# Patient Record
Sex: Male | Born: 1960 | Race: White | Hispanic: No | Smoking: Current some day smoker
Health system: Southern US, Community
[De-identification: ages and names within clinical notes are randomized; demographics above are authoritative.]

## PROBLEM LIST (undated history)

## (undated) ENCOUNTER — Telehealth

## (undated) ENCOUNTER — Inpatient Hospital Stay

## (undated) ENCOUNTER — Ambulatory Visit

## (undated) ENCOUNTER — Telehealth
Attending: Pharmacist Clinician (PhC)/ Clinical Pharmacy Specialist | Primary: Pharmacist Clinician (PhC)/ Clinical Pharmacy Specialist

## (undated) ENCOUNTER — Encounter

## (undated) ENCOUNTER — Ambulatory Visit: Payer: MEDICAID | Attending: Gastroenterology | Primary: Gastroenterology

## (undated) ENCOUNTER — Encounter
Attending: Pharmacist Clinician (PhC)/ Clinical Pharmacy Specialist | Primary: Pharmacist Clinician (PhC)/ Clinical Pharmacy Specialist

## (undated) ENCOUNTER — Ambulatory Visit: Attending: Family | Primary: Family

## (undated) ENCOUNTER — Ambulatory Visit
Attending: Pharmacist Clinician (PhC)/ Clinical Pharmacy Specialist | Primary: Pharmacist Clinician (PhC)/ Clinical Pharmacy Specialist

## (undated) DIAGNOSIS — M1990 Unspecified osteoarthritis, unspecified site: Secondary | ICD-10-CM

## (undated) DIAGNOSIS — M109 Gout, unspecified: Secondary | ICD-10-CM

## (undated) DIAGNOSIS — I10 Essential (primary) hypertension: Secondary | ICD-10-CM

## (undated) HISTORY — PX: APPENDECTOMY: SHX54

---

## 2011-01-15 ENCOUNTER — Emergency Department (HOSPITAL_COMMUNITY): Payer: Self-pay

## 2011-01-15 ENCOUNTER — Emergency Department (HOSPITAL_COMMUNITY)
Admission: EM | Admit: 2011-01-15 | Discharge: 2011-01-15 | Disposition: A | Discharge status: Home / Self Care | Payer: Self-pay | Attending: Emergency Medicine | Admitting: Emergency Medicine

## 2011-01-15 DIAGNOSIS — W108XXA Fall (on) (from) other stairs and steps, initial encounter: Secondary | ICD-10-CM | POA: Insufficient documentation

## 2011-01-15 DIAGNOSIS — S52209A Unspecified fracture of shaft of unspecified ulna, initial encounter for closed fracture: Secondary | ICD-10-CM | POA: Insufficient documentation

## 2011-01-16 ENCOUNTER — Ambulatory Visit (HOSPITAL_COMMUNITY)
Admission: RE | Admit: 2011-01-16 | Discharge: 2011-01-16 | Disposition: A | Discharge status: Home / Self Care | Payer: Self-pay | Source: Ambulatory Visit | Attending: Orthopaedic Surgery | Admitting: Orthopaedic Surgery

## 2011-01-16 ENCOUNTER — Other Ambulatory Visit (HOSPITAL_COMMUNITY): Payer: Self-pay | Admitting: Orthopaedic Surgery

## 2011-01-16 DIAGNOSIS — S52209A Unspecified fracture of shaft of unspecified ulna, initial encounter for closed fracture: Secondary | ICD-10-CM

## 2011-01-16 DIAGNOSIS — Z4789 Encounter for other orthopedic aftercare: Secondary | ICD-10-CM | POA: Insufficient documentation

## 2011-01-18 NOTE — Consult Note (Signed)
  [name], Hector White              ACCOUNT NO.:  [id number]  MEDICAL RECORD NO.:  [id number]           PATIENT TYPE:  E  LOCATION:  [facility]                          FACILITY:  [facility]  PHYSICIAN:  J. Darreld Mclean, M.D. DATE OF BIRTH:  09-Apr-1961  DATE OF CONSULTATION: DATE OF DISCHARGE:  01/15/2011                                CONSULTATION   The patient seen in the ER at the request of the physician assistant.  The patient is a 50 year old male who fell on his steps today, landed and hit his left forearm from against one of his steps, he felt a pop and heard a crack and immediate pain to his left mid forearm.  X-rays here at the hospital show a essentially nondisplaced fracture of the left midshaft ulnar.  There were no other injuries.  It was a closed injury.  He was then placed in a sugar-tong splint.  He was given something for pain.  Please refer to the PA notes and the nurses' notes and I have reviewed these.  IMPRESSION:  Closed fracture, left mid shaft ulna.  I explained to him that sometimes this fracture "slips," so he needs fixation with pins and plates.  I want to see him in the office tomorrow afternoon.  He is to keep his splint on, use a sling, take his pain medicine, use ice.  If any difficulty, return to the ER.          ______________________________ J. Darreld Mclean, M.D.     JWK/MEDQ  D:  01/15/2011  T:  01/16/2011  Job:  [id number]  Electronically Signed by Darreld Mclean M.D. on 01/18/2011 08:10:47 AM

## 2011-01-23 ENCOUNTER — Other Ambulatory Visit (HOSPITAL_COMMUNITY): Payer: Self-pay | Admitting: Orthopaedic Surgery

## 2011-01-23 ENCOUNTER — Ambulatory Visit (HOSPITAL_COMMUNITY)
Admission: RE | Admit: 2011-01-23 | Discharge: 2011-01-23 | Disposition: A | Discharge status: Home / Self Care | Payer: Self-pay | Source: Ambulatory Visit | Attending: Orthopaedic Surgery | Admitting: Orthopaedic Surgery

## 2011-01-23 DIAGNOSIS — T148XXA Other injury of unspecified body region, initial encounter: Secondary | ICD-10-CM

## 2011-01-23 DIAGNOSIS — Z4789 Encounter for other orthopedic aftercare: Secondary | ICD-10-CM | POA: Insufficient documentation

## 2011-01-23 DIAGNOSIS — M25529 Pain in unspecified elbow: Secondary | ICD-10-CM | POA: Insufficient documentation

## 2011-02-13 ENCOUNTER — Other Ambulatory Visit (HOSPITAL_COMMUNITY): Payer: Self-pay | Admitting: Orthopaedic Surgery

## 2011-02-13 ENCOUNTER — Ambulatory Visit (HOSPITAL_COMMUNITY)
Admission: RE | Admit: 2011-02-13 | Discharge: 2011-02-13 | Disposition: A | Discharge status: Home / Self Care | Payer: Self-pay | Source: Ambulatory Visit | Attending: Orthopaedic Surgery | Admitting: Orthopaedic Surgery

## 2011-02-13 DIAGNOSIS — T148XXA Other injury of unspecified body region, initial encounter: Secondary | ICD-10-CM

## 2011-02-13 DIAGNOSIS — IMO0001 Reserved for inherently not codable concepts without codable children: Secondary | ICD-10-CM | POA: Insufficient documentation

## 2011-03-13 ENCOUNTER — Ambulatory Visit (HOSPITAL_COMMUNITY)
Admission: RE | Admit: 2011-03-13 | Discharge: 2011-03-13 | Disposition: A | Discharge status: Home / Self Care | Payer: Self-pay | Source: Ambulatory Visit | Attending: Orthopaedic Surgery | Admitting: Orthopaedic Surgery

## 2011-03-13 ENCOUNTER — Other Ambulatory Visit (HOSPITAL_COMMUNITY): Payer: Self-pay | Admitting: Orthopaedic Surgery

## 2011-03-13 DIAGNOSIS — S52209A Unspecified fracture of shaft of unspecified ulna, initial encounter for closed fracture: Secondary | ICD-10-CM

## 2011-03-13 DIAGNOSIS — Z4789 Encounter for other orthopedic aftercare: Secondary | ICD-10-CM | POA: Insufficient documentation

## 2012-12-14 IMAGING — CR DG FOREARM 2V*L*
2 series · 2 of 2 positions shown · non-contrast
Comparison: None

CLINICAL DATA: Mid forearm pain.  Fall.

LEFT FOREARM - 2 VIEW

[view not recorded (1 of 2)]
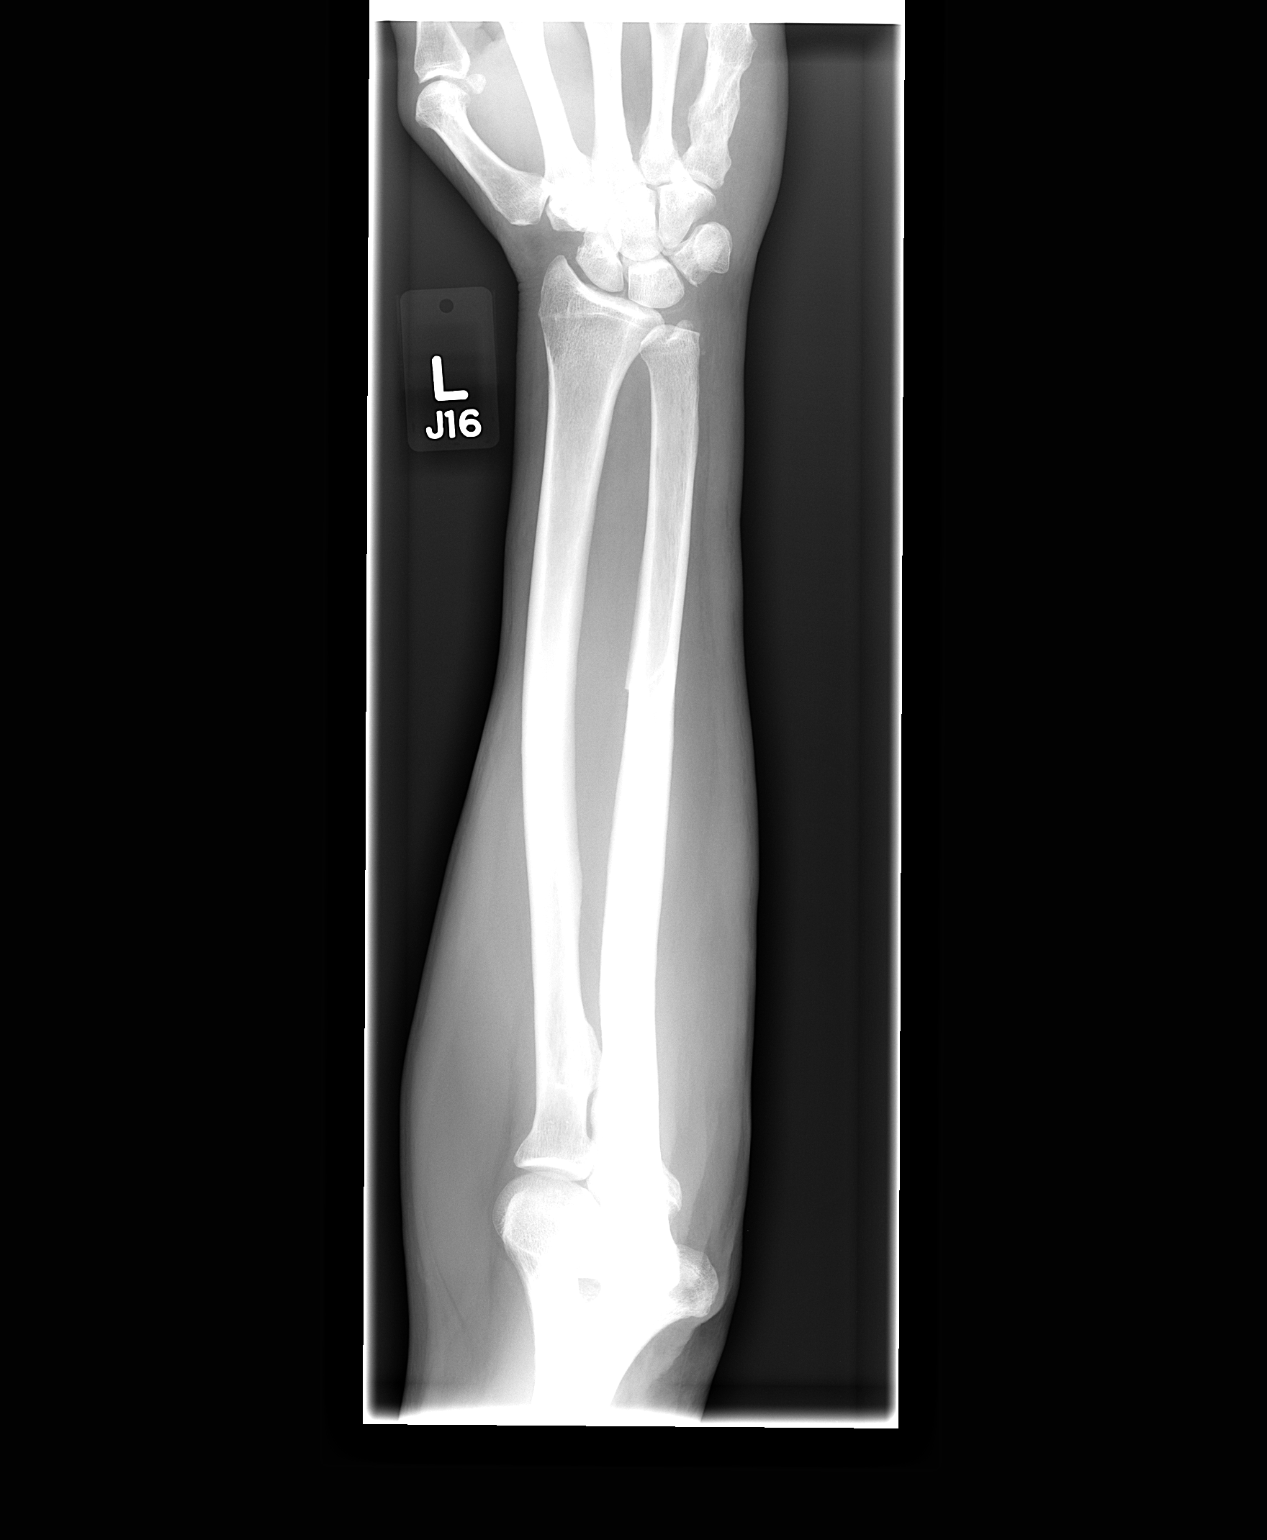

[view not recorded (2 of 2)]
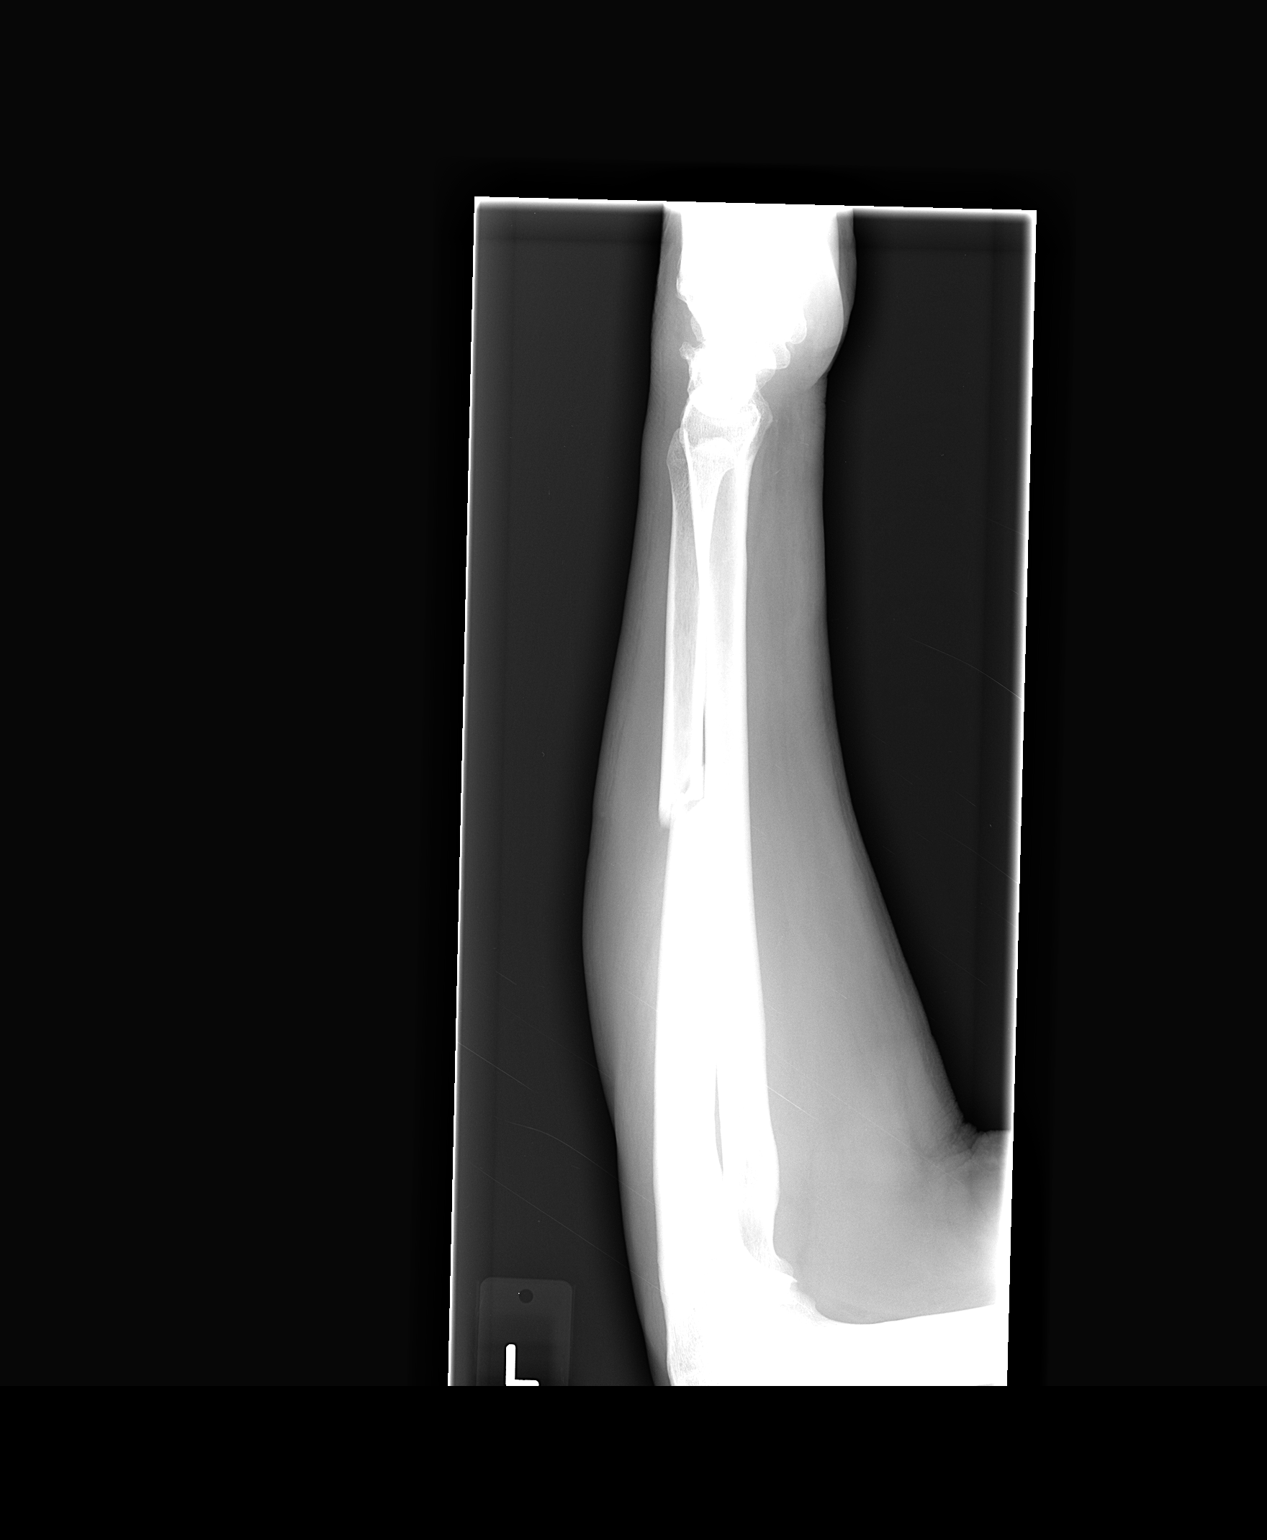

[2 of 2 positions shown; findings below may reference images not displayed]

FINDINGS: There is a fracture through the midshaft of the left
ulna.  Approximately 6 mm of displacement.  No radial abnormality
visualized.
IMPRESSION: Slightly displaced left ulnar midshaft fracture.

## 2012-12-14 IMAGING — CR DG ELBOW COMPLETE 3+V*L*
4 series · 4 of 4 positions shown · non-contrast
Comparison: None

CLINICAL DATA: Left forearm and elbow pain.  Recent fall.

LEFT ELBOW - COMPLETE 3+ VIEW

[view not recorded (1 of 4)]
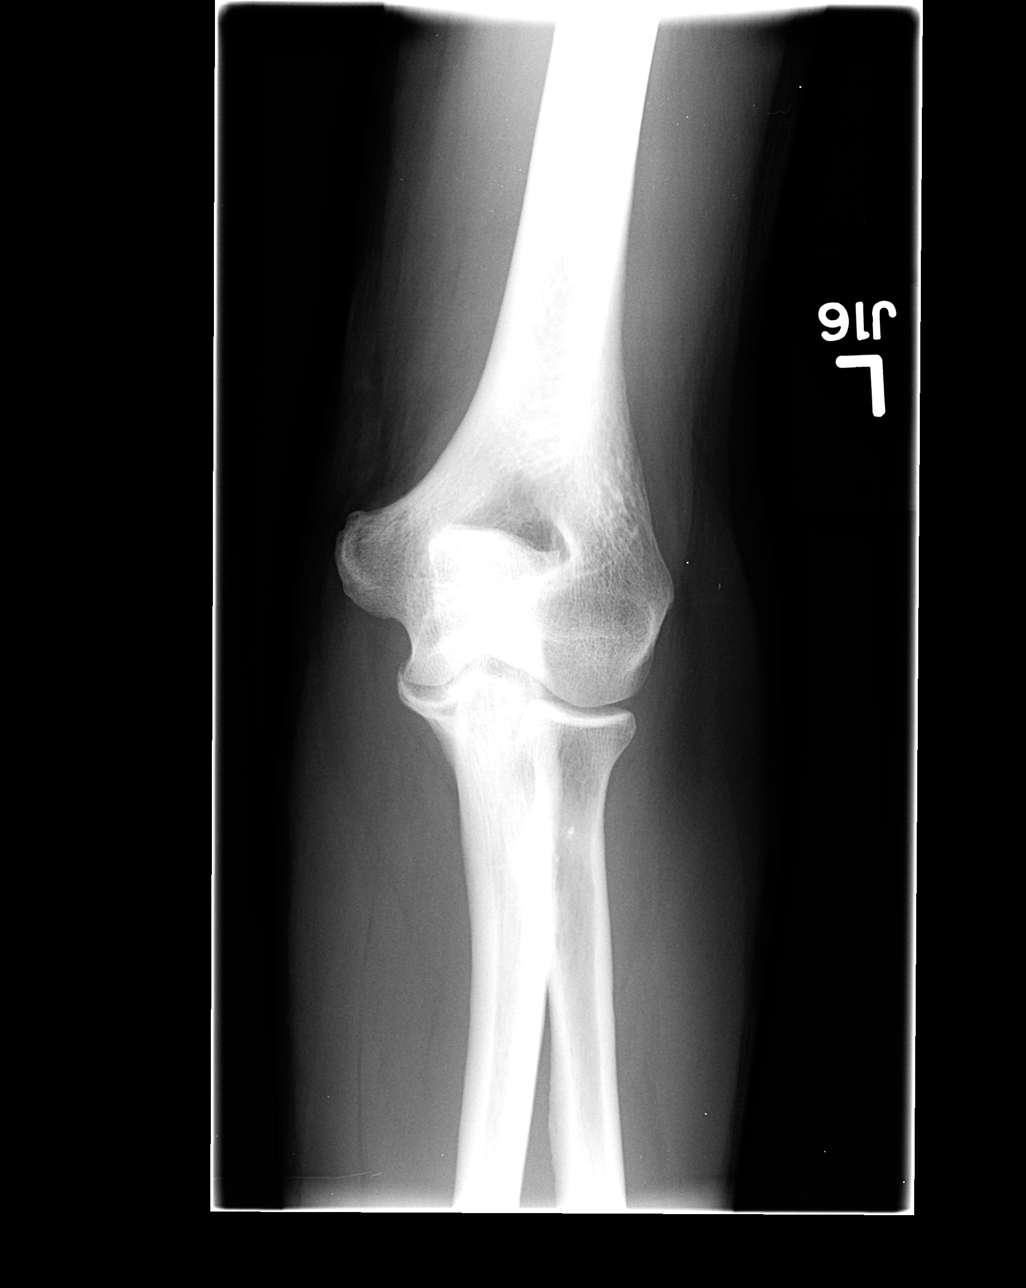

[view not recorded (2 of 4)]
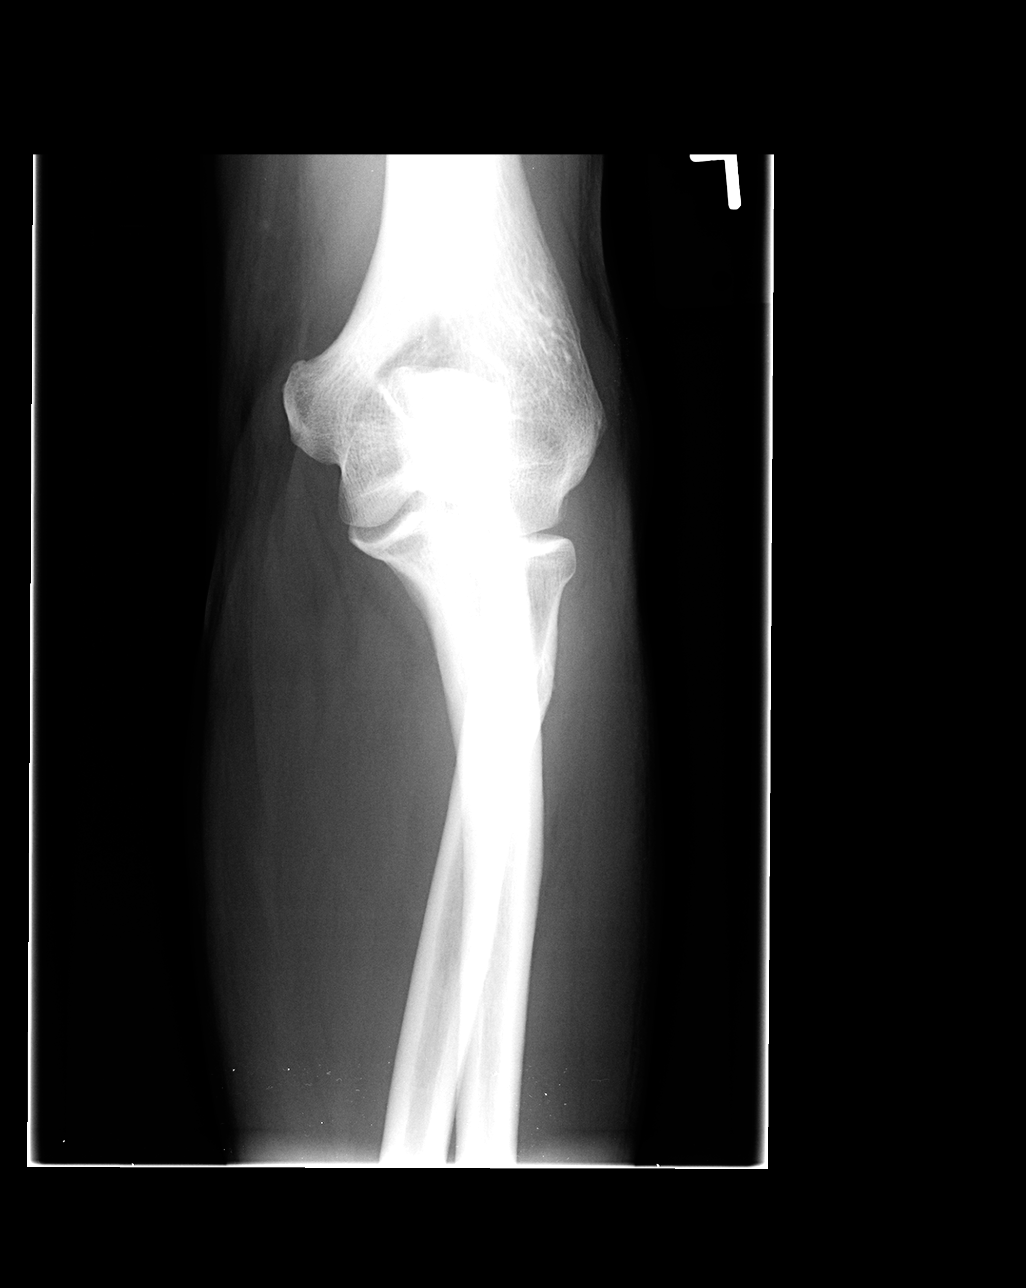

[view not recorded (3 of 4)]
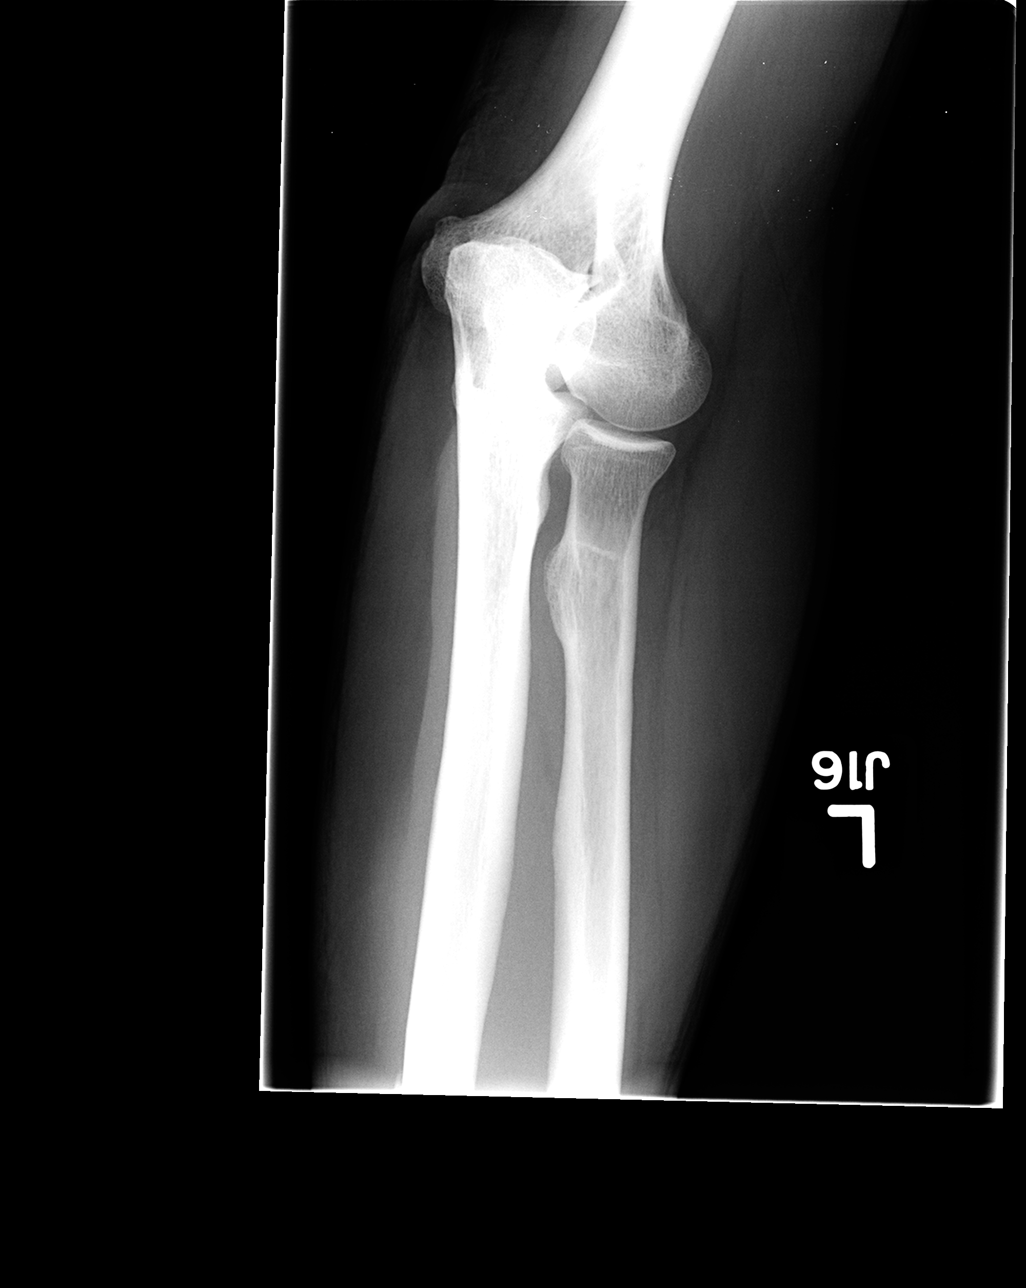

[view not recorded (4 of 4)]
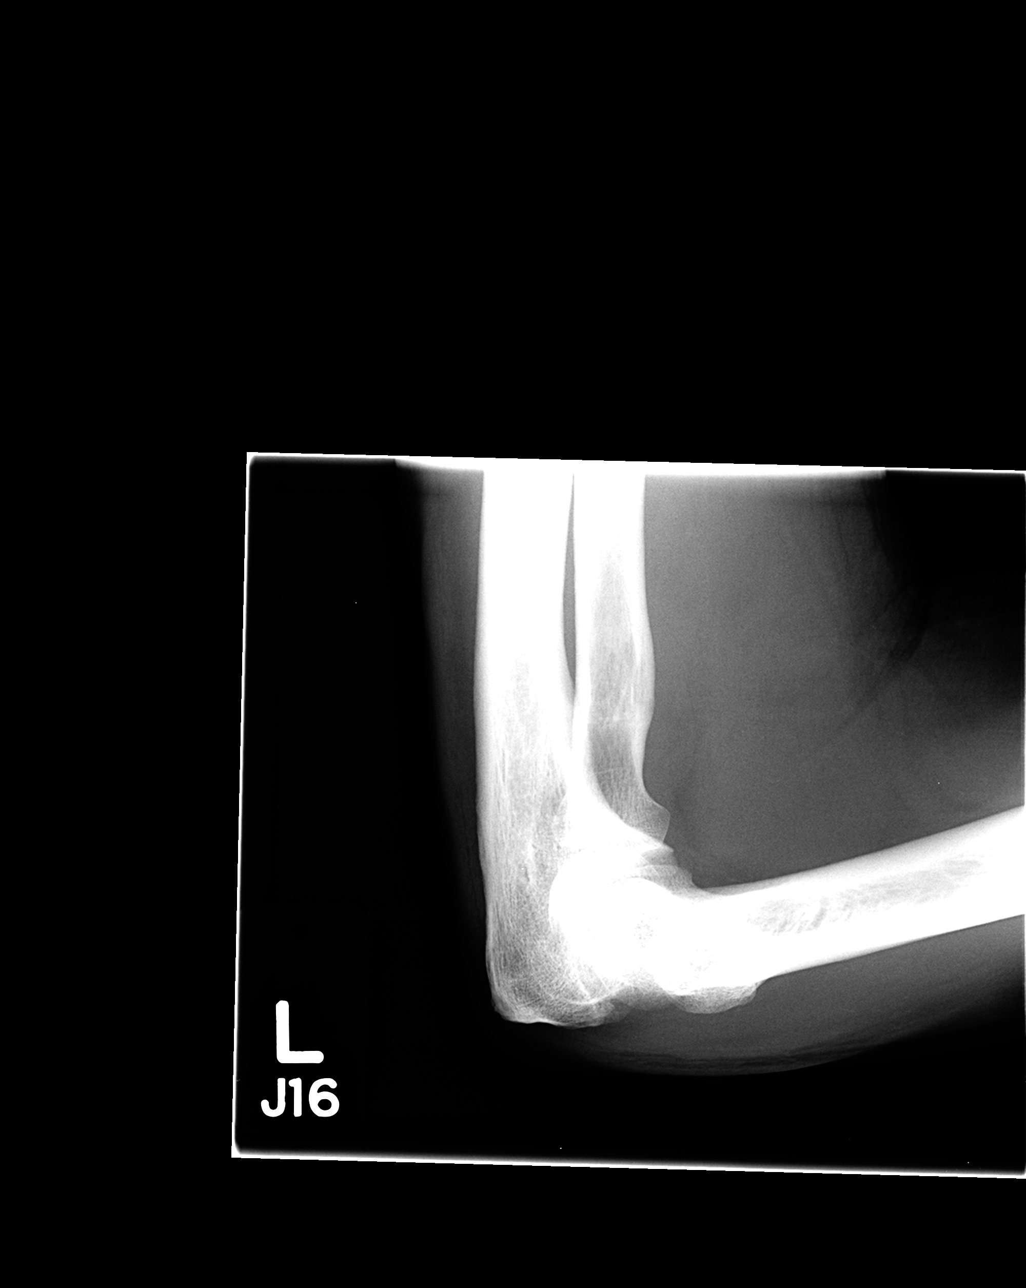

[4 of 4 positions shown; findings below may reference images not displayed]

FINDINGS: Mild arthritic changes within the left elbow. No acute
bony abnormality.  Specifically, no fracture, subluxation, or
dislocation.  Soft tissues are intact.  No joint effusion.
IMPRESSION: No acute bony abnormality.

## 2012-12-15 IMAGING — CR DG FOREARM 2V*L*
2 series · 2 of 2 positions shown · non-contrast
Comparison: 01/15/2011

CLINICAL DATA: Ulnar shaft fracture, follow-up

LEFT FOREARM - 2 VIEW

[view not recorded (1 of 2)]
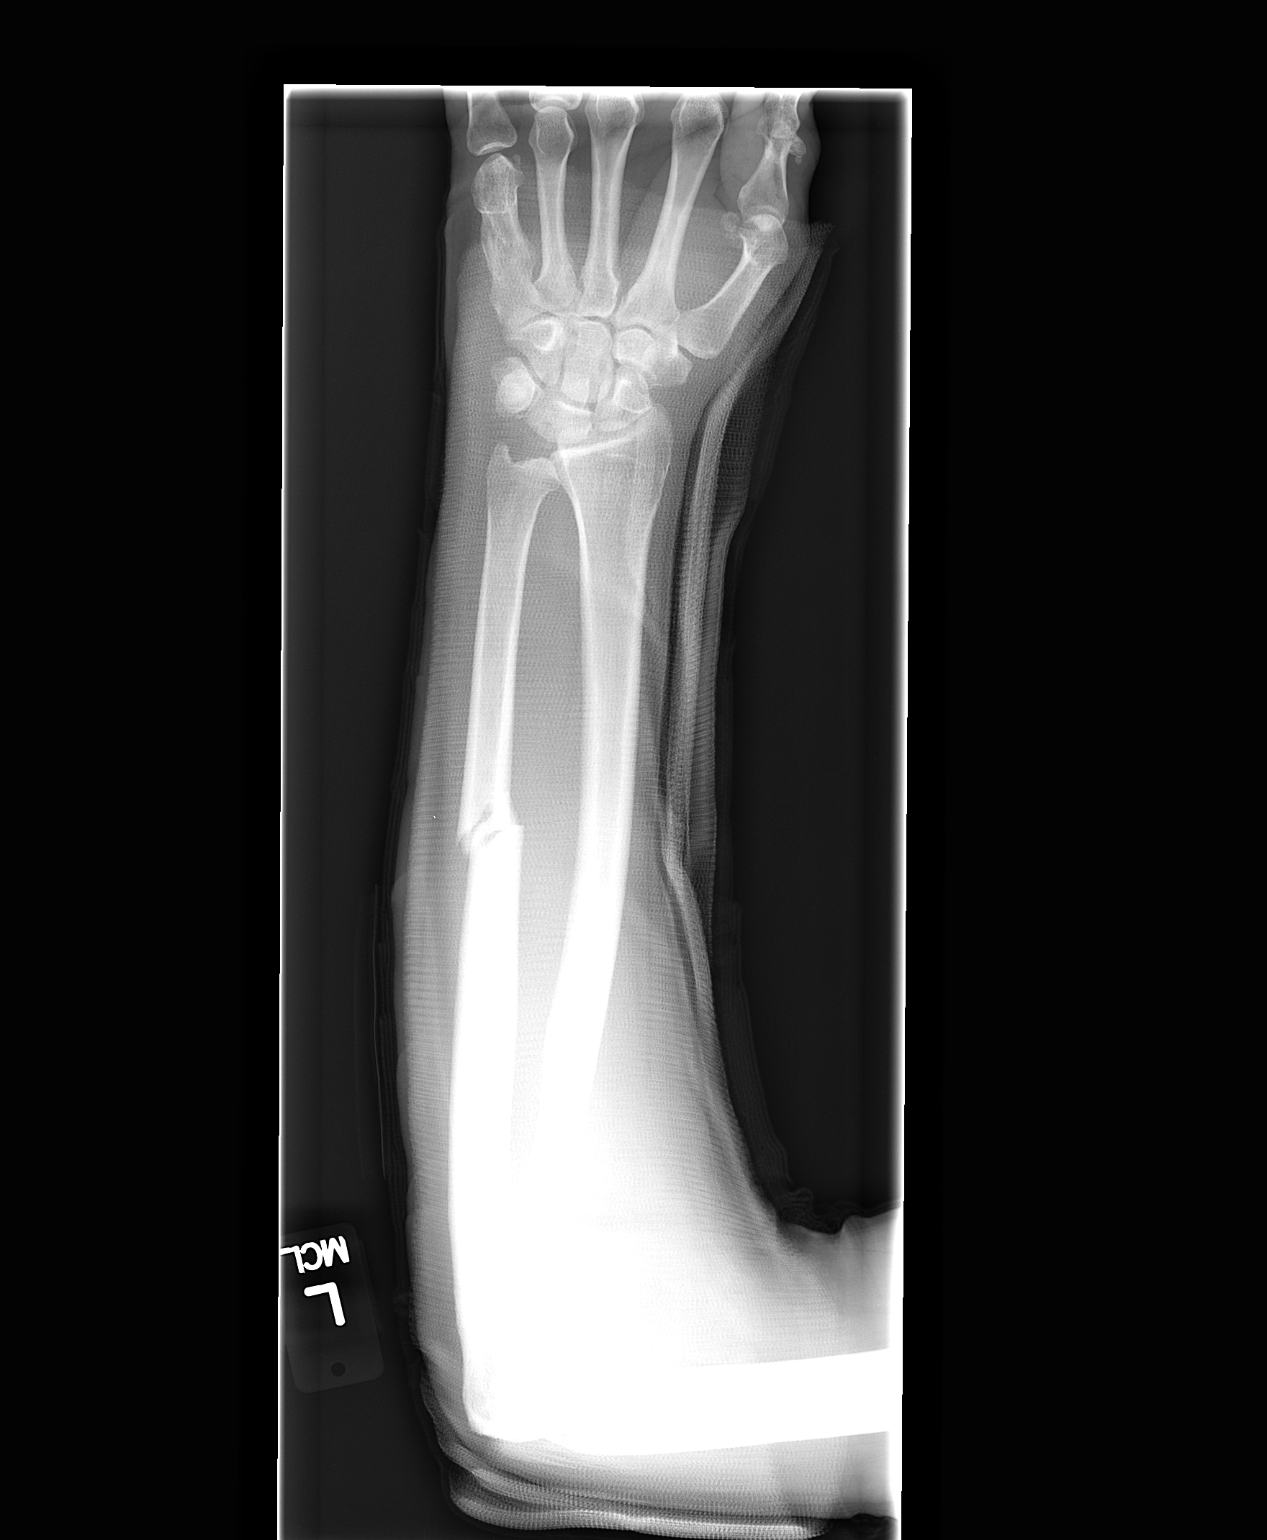

[view not recorded (2 of 2)]
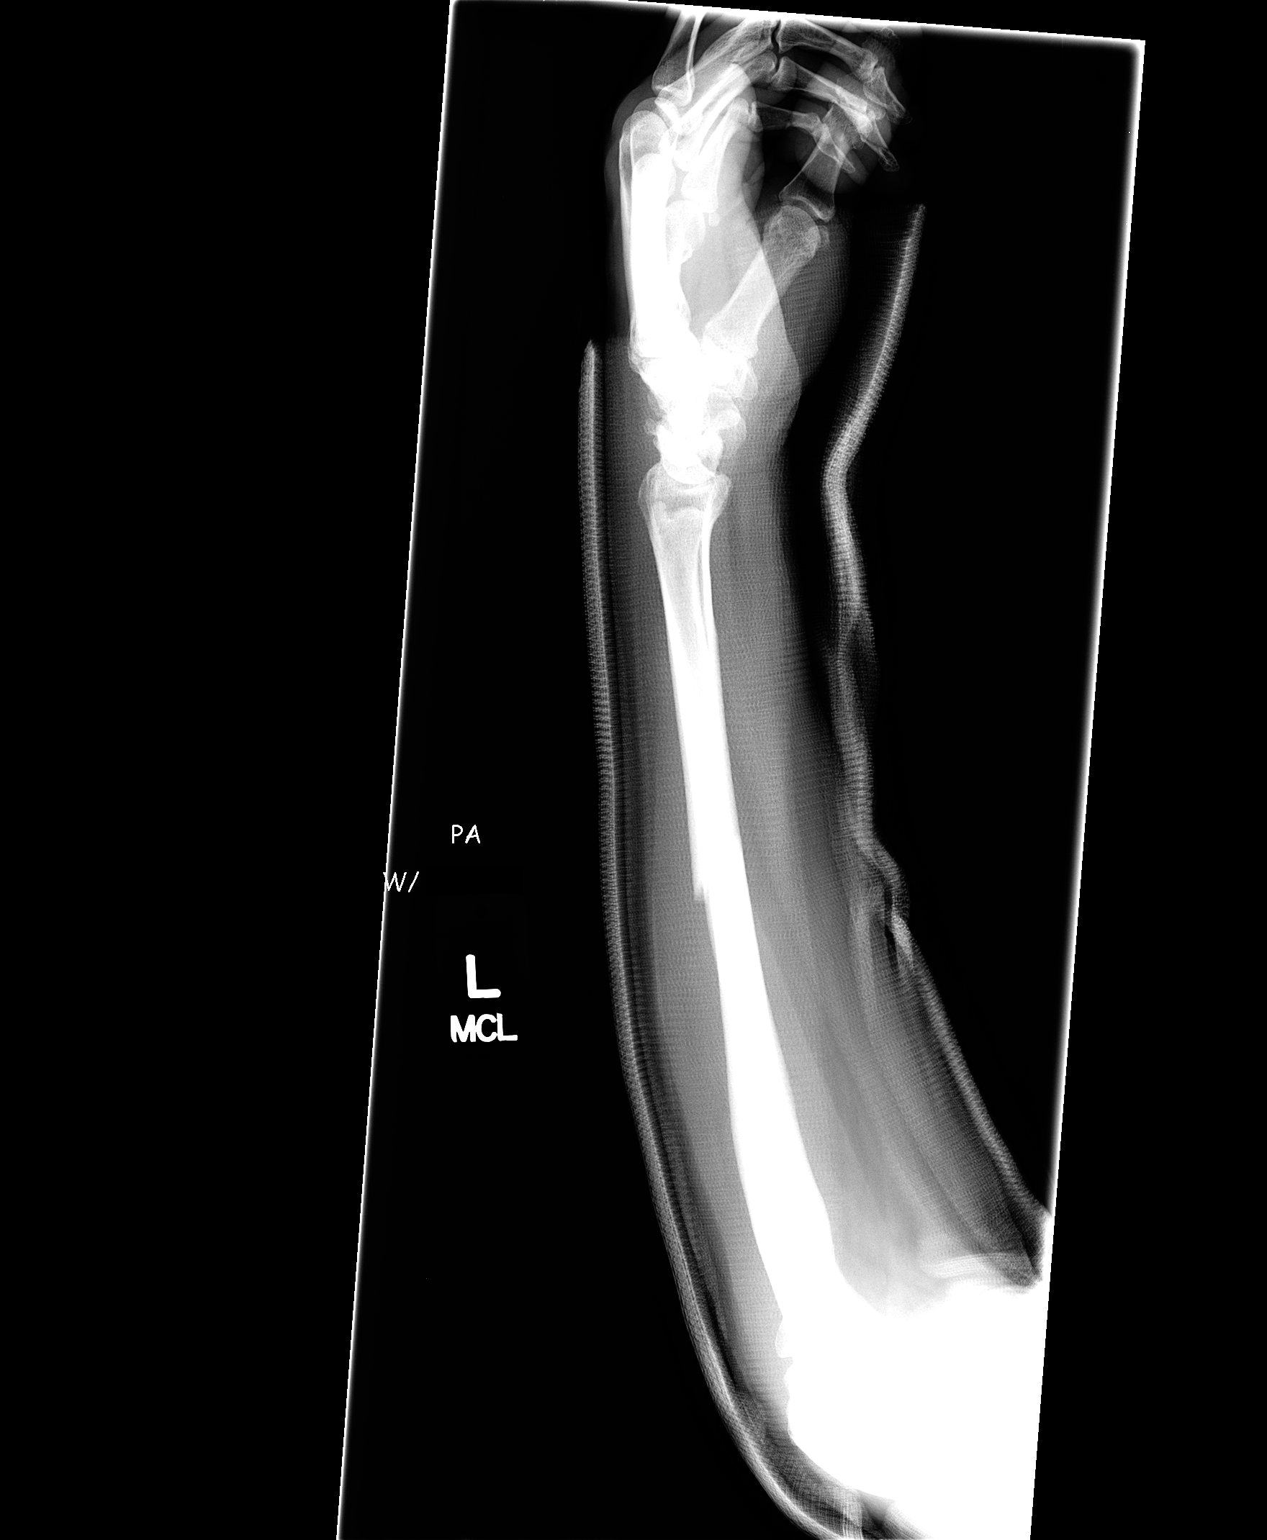

[2 of 2 positions shown; findings below may reference images not displayed]

FINDINGS: Fiberglass splint material obscures bony detail.
Minimally oblique fracture of the mid left ulnar diaphysis,
displaced approximately 4 mm ulnar.
Wrist and elbow joint alignments grossly normal.
Radius appears intact.
No acute bony abnormalities identified.
IMPRESSION: Minimally displaced mid left ulnar diaphyseal fracture.

## 2013-06-04 ENCOUNTER — Encounter (HOSPITAL_COMMUNITY): Payer: Self-pay | Admitting: Emergency Medicine

## 2013-06-04 ENCOUNTER — Emergency Department (HOSPITAL_COMMUNITY): Payer: Self-pay

## 2013-06-04 ENCOUNTER — Emergency Department (HOSPITAL_COMMUNITY)
Admission: EM | Admit: 2013-06-04 | Discharge: 2013-06-04 | Disposition: A | Discharge status: Home / Self Care | Payer: Self-pay | Attending: Emergency Medicine | Admitting: Emergency Medicine

## 2013-06-04 DIAGNOSIS — M1710 Unilateral primary osteoarthritis, unspecified knee: Secondary | ICD-10-CM | POA: Insufficient documentation

## 2013-06-04 DIAGNOSIS — IMO0002 Reserved for concepts with insufficient information to code with codable children: Secondary | ICD-10-CM | POA: Insufficient documentation

## 2013-06-04 DIAGNOSIS — M1990 Unspecified osteoarthritis, unspecified site: Secondary | ICD-10-CM

## 2013-06-04 DIAGNOSIS — M25469 Effusion, unspecified knee: Secondary | ICD-10-CM | POA: Insufficient documentation

## 2013-06-04 DIAGNOSIS — M109 Gout, unspecified: Secondary | ICD-10-CM | POA: Insufficient documentation

## 2013-06-04 DIAGNOSIS — Z79899 Other long term (current) drug therapy: Secondary | ICD-10-CM | POA: Insufficient documentation

## 2013-06-04 DIAGNOSIS — I10 Essential (primary) hypertension: Secondary | ICD-10-CM | POA: Insufficient documentation

## 2013-06-04 DIAGNOSIS — F17200 Nicotine dependence, unspecified, uncomplicated: Secondary | ICD-10-CM | POA: Insufficient documentation

## 2013-06-04 DIAGNOSIS — M25549 Pain in joints of unspecified hand: Secondary | ICD-10-CM | POA: Insufficient documentation

## 2013-06-04 DIAGNOSIS — G8929 Other chronic pain: Secondary | ICD-10-CM | POA: Insufficient documentation

## 2013-06-04 HISTORY — DX: Gout, unspecified: M10.9

## 2013-06-04 HISTORY — DX: Unspecified osteoarthritis, unspecified site: M19.90

## 2013-06-04 HISTORY — DX: Essential (primary) hypertension: I10

## 2013-06-04 MED ORDER — TRAMADOL HCL 50 MG PO TABS: 50.0000 mg | ORAL_TABLET | Freq: Four times a day (QID) | ORAL | Status: DC | PRN

## 2013-06-04 NOTE — ED Notes (Signed)
Complain of pain and swelling in left thumb and right knee/

## 2013-06-04 NOTE — ED Notes (Signed)
Patient with no complaints at this time. Respirations even and unlabored. Skin warm/dry. Discharge instructions reviewed with patient at this time. Patient given opportunity to voice concerns/ask questions. Patient discharged at this time and left Emergency Department with steady gait.   

## 2013-06-04 NOTE — Progress Notes (Signed)
ED/CM noted patient did not have health insurance and/or PCP listed in the computer.  Patient was given the [place] resource handout with information on the clinics, food pantries, and the handout for new health insurance sign-up.  Patient expressed appreciation for this. 

## 2013-06-04 NOTE — ED Provider Notes (Signed)
CSN: [id number]     Arrival date & time 06/04/13  6578 History   First MD Initiated Contact with Patient 06/04/13 [phone]     Chief Complaint  Patient presents with  . Knee Pain   (Consider location/radiation/quality/duration/timing/severity/associated sxs/prior Treatment) HPI Comments: Hector White is a 52 y.o. Male presenting with several complaints of pain, first, he describes a long standing history of swelling, pain in his left thumb dip joint, which has worsened over the past several months.  Additionally,  He has bilateral knee swelling and pain,  His right knee greater than his left,  Also a chronic condition.  He denies any new injuries.  He has been followed by a physician at [facility] but it it becoming increasing difficult to obtain transportation there.  He is also applying for disability for his chronic arthritis condition.  He has taken tramadol and ibuprofen for his symptoms which he has run out of several days ago.     The history is provided by the patient.    Past Medical History  Diagnosis Date  . Hypertension   . Arthritis   . Gout    Past Surgical History  Procedure Laterality Date  . Appendectomy     No family history on file. History  Substance Use Topics  . Smoking status: Current Some Day Smoker  . Smokeless tobacco: Not on file  . Alcohol Use: Yes    Review of Systems  Constitutional: Negative for fever.  Musculoskeletal: Positive for arthralgias and joint swelling. Negative for myalgias.  Neurological: Negative for weakness and numbness.    Allergies  Review of patient's allergies indicates no known allergies.  Home Medications   Current Outpatient Rx  Name  Route  Sig  Dispense  Refill  . ibuprofen (ADVIL,MOTRIN) 200 MG tablet   Oral   Take 200-400 mg by mouth 2 (two) times daily as needed for pain.         Marland Kitchen lisinopril-hydrochlorothiazide (PRINZIDE,ZESTORETIC) 20-12.5 MG per tablet   Oral   Take 1 tablet by mouth daily.         . traMADol (ULTRAM) 50 MG tablet   Oral   Take 50 mg by mouth 2 (two) times daily as needed for pain.         . traMADol (ULTRAM) 50 MG tablet   Oral   Take 1 tablet (50 mg total) by mouth every 6 (six) hours as needed for pain.   20 tablet   0    BP 125/88  Pulse 61  Temp(Src) 98.1 F (36.7 C) (Oral)  Resp 18  Ht 5\' 8"  (1.727 m)  Wt 165 lb (74.844 kg)  BMI 25.09 kg/m2  SpO2 99% Physical Exam  Constitutional: He appears well-developed and well-nourished.  HENT:  Head: Atraumatic.  Neck: Normal range of motion.  Cardiovascular:  Pulses equal bilaterally  Musculoskeletal: He exhibits tenderness.       Right knee: He exhibits effusion. He exhibits no deformity, no erythema, normal alignment, no LCL laxity and no MCL laxity. Tenderness found.       Left knee: He exhibits effusion. He exhibits no erythema, no LCL laxity and no MCL laxity.       Left hand: He exhibits decreased range of motion and tenderness. He exhibits normal capillary refill. Normal sensation noted. Normal strength noted.  Decreased ROM left dip joint of thumb with significant deformity,  Bony arthritic nodule. Less than 3 sec distal cap refill.  No edema, no erythema.  Skin intact.  Neurological: He is alert. He has normal strength. He displays normal reflexes. No sensory deficit.  Equal strength  Skin: Skin is warm and dry.  Psychiatric: He has a normal mood and affect.    ED Course  Procedures (including critical care time) Labs Review Labs Reviewed - No data to display Imaging Review Dg Knee Complete 4 Views Right  06/04/2013   CLINICAL DATA:  52 year old male with pain.  EXAM: RIGHT KNEE - COMPLETE 4+ VIEW  COMPARISON:  None.  FINDINGS: A suprapatellar joint effusion is suspected, small to moderate. Patella appears intact. Joint spaces relatively preserved. Chondrocalcinosis which can be seen in the setting of calcium pyrophosphate deposition disease. No fracture or dislocation identified. Small  posttraumatic osteophyte or less likely small osteochondroma of the lateral right fibular head. Bone mineralization is within normal limits.  IMPRESSION: No acute fracture or dislocation. Positive joint effusion. Chondrocalcinosis which can be seen in the setting of calcium pyrophosphate deposition disease.   Electronically Signed   By: Augusto Gamble M.D.   On: 06/04/2013 10:19   Dg Finger Thumb Left  06/04/2013   CLINICAL DATA:  52 year old male with left thumb pain and palpable abnormality.  EXAM: LEFT THUMB 2+V  COMPARISON:  None.  FINDINGS: Left thumb interphalangeal joint space loss with bulky osteophytosis. Mild subchondral sclerosis. Bulky dorsal osteophyte. Left MCP joint appears within normal limits. No fracture dislocation identified.  IMPRESSION: Bulky degenerative and or posttraumatic osteophytosis at the left thumb IP joint. No acute fracture or dislocation.   Electronically Signed   By: Augusto Gamble M.D.   On: 06/04/2013 10:17    MDM   1. Osteoarthritis    Patients labs and/or radiological studies were viewed and considered during the medical decision making and disposition process.  Pt with chronic arthritis sx.  No erythema or increased warmth to suggest gouty flare.  Prescribed tramadol.  Encouraged f/u with his providers.  The patient appears reasonably screened and/or stabilized for discharge and I doubt any other medical condition or other [facility] requiring further screening, evaluation, or treatment in the ED at this time prior to discharge.      Burgess Amor, PA-C 06/04/13 1032

## 2013-06-09 NOTE — ED Provider Notes (Signed)
Medical screening examination/treatment/procedure(s) were performed by non-physician practitioner and as supervising physician I was immediately available for consultation/collaboration.   Jeovany Huitron J Anyelina Claycomb, MD 06/09/13 2212 

## 2017-11-11 ENCOUNTER — Ambulatory Visit: Admit: 2017-11-11 | Discharge: 2017-11-12

## 2017-11-11 DIAGNOSIS — B182 Chronic viral hepatitis C: Secondary | ICD-10-CM

## 2017-11-11 DIAGNOSIS — Z7289 Other problems related to lifestyle: Secondary | ICD-10-CM

## 2017-11-22 ENCOUNTER — Emergency Department (HOSPITAL_COMMUNITY): Payer: Self-pay

## 2017-11-22 ENCOUNTER — Other Ambulatory Visit: Payer: Self-pay

## 2017-11-22 ENCOUNTER — Emergency Department (HOSPITAL_COMMUNITY)
Admission: EM | Admit: 2017-11-22 | Discharge: 2017-11-22 | Disposition: A | Discharge status: Home / Self Care | Payer: Self-pay | Attending: Emergency Medicine | Admitting: Emergency Medicine

## 2017-11-22 ENCOUNTER — Encounter (HOSPITAL_COMMUNITY): Payer: Self-pay | Admitting: Emergency Medicine

## 2017-11-22 DIAGNOSIS — W19XXXA Unspecified fall, initial encounter: Secondary | ICD-10-CM

## 2017-11-22 DIAGNOSIS — S2232XA Fracture of one rib, left side, initial encounter for closed fracture: Secondary | ICD-10-CM

## 2017-11-22 DIAGNOSIS — S39012A Strain of muscle, fascia and tendon of lower back, initial encounter: Secondary | ICD-10-CM

## 2017-11-22 DIAGNOSIS — I10 Essential (primary) hypertension: Secondary | ICD-10-CM | POA: Insufficient documentation

## 2017-11-22 DIAGNOSIS — Y9389 Activity, other specified: Secondary | ICD-10-CM | POA: Insufficient documentation

## 2017-11-22 DIAGNOSIS — Y998 Other external cause status: Secondary | ICD-10-CM | POA: Insufficient documentation

## 2017-11-22 DIAGNOSIS — Y929 Unspecified place or not applicable: Secondary | ICD-10-CM | POA: Insufficient documentation

## 2017-11-22 DIAGNOSIS — W010XXA Fall on same level from slipping, tripping and stumbling without subsequent striking against object, initial encounter: Secondary | ICD-10-CM | POA: Insufficient documentation

## 2017-11-22 DIAGNOSIS — F17210 Nicotine dependence, cigarettes, uncomplicated: Secondary | ICD-10-CM | POA: Insufficient documentation

## 2017-11-22 NOTE — ED Triage Notes (Signed)
Pt fell today on a concrete porch between 1200-1300, pt unsure of LOC or hitting head, tonight pt reports pain to left hip/back area but reports numbness to left side

## 2017-11-22 NOTE — ED Provider Notes (Signed)
Reassessment, patient more awake.  He is moving extremities without difficulty.  No signs of abdominal trauma.  I would withhold any further pain medicine, as patient was very somnolent I suspect he has been drinking alcohol He has ride home from brother    Hector White, Hector Horsfall, MD 11/22/17 [id number]

## 2017-11-22 NOTE — ED Provider Notes (Signed)
[facility] EMERGENCY DEPARTMENT Provider Note   CSN: [id number] Arrival date & time: 11/22/17  4098     History   Chief Complaint Chief Complaint  Patient presents with  . Fall    HPI Hector White is a 57 y.o. male.  The history is provided by the patient and a relative.  Fall  This is a new problem. The current episode started yesterday. The problem occurs constantly. The problem has been gradually worsening. Pertinent negatives include no abdominal pain, no headaches and no shortness of breath. The symptoms are aggravated by twisting. The symptoms are relieved by rest.  Presents after a fall. He reports that occurred on March 28.  He reports he was yelling at people he was in his brother discharge, and he fell in his back.  He has pain in his back, left hip and his posterior chest.  He reports it hurts to walk.  He reports he is having numbness in his arms and legs, but admits this is not new.  There is no anterior chest or abdominal pain. No head injury.  No LOC. Past Medical History:  Diagnosis Date  . Arthritis   . Gout   . Hypertension     There are no active problems to display for this patient.   Past Surgical History:  Procedure Laterality Date  . APPENDECTOMY          Home Medications    Prior to Admission medications   Medication Sig Start Date End Date Taking? Authorizing Provider  ibuprofen (ADVIL,MOTRIN) 200 MG tablet Take 200-400 mg by mouth 2 (two) times daily as needed for pain.    [provider]  lisinopril-hydrochlorothiazide (PRINZIDE,ZESTORETIC) 20-12.5 MG per tablet Take 1 tablet by mouth daily.    [provider]  traMADol (ULTRAM) 50 MG tablet Take 50 mg by mouth 2 (two) times daily as needed for pain.    [provider]  traMADol (ULTRAM) 50 MG tablet Take 1 tablet (50 mg total) by mouth every 6 (six) hours as needed for pain. 06/04/13   Burgess Amor, PA-C    Family History No family history on  file.  Social History Social History   Tobacco Use  . Smoking status: Current Some Day Smoker    Types: Cigarettes  . Smokeless tobacco: Never Used  . Tobacco comment: 2-3/day  Substance Use Topics  . Alcohol use: Yes    Comment: 1 pint daily  . Drug use: Never     Allergies   Patient has no known allergies.   Review of Systems Review of Systems  Constitutional: Negative for fever.  Respiratory: Negative for shortness of breath.   Gastrointestinal: Negative for abdominal pain.  Musculoskeletal: Positive for arthralgias and back pain.  Neurological: Negative for headaches.  All other systems reviewed and are negative.    Physical Exam Updated Vital Signs BP 105/67   Pulse 72   Temp (!) 97.4 F (36.3 C) (Oral)   Resp 15   Ht 1.753 m (5\' 9" )   Wt 78 kg (172 lb)   SpO2 99%   BMI 25.40 kg/m   Physical Exam CONSTITUTIONAL: Disheveled, sleeping on exam HEAD: Normocephalic/atraumatic EYES: EOMI/PERRL ENMT: Mucous membranes moist NECK: supple no meningeal signs SPINE/BACK: No cervical or thoracic tenderness, lumbar tenderness noted.  No bruising/crepitance/stepoffs noted to spine CV: S1/S2 noted, no murmurs/rubs/gallops noted LUNGS: Lungs are clear to auscultation bilaterally, no apparent distress Chest-diffuse tenderness to left posterior chest No Crepitus ABDOMEN: soft, nontender, no  rebound or guarding, bowel sounds noted throughout abdomen GU:no cva tenderness NEURO: Pt is sleeping on arrival.  He wakes up he moves all extremities x4 EXTREMITIES: pulses normal/equal, full ROM, mild tenderness to range of motion left hip.  No bruising.  All other extremities/joints palpated/ranged and nontender SKIN: warm, color normal  ED Treatments / Results  Labs (all labs ordered are listed, but only abnormal results are displayed) Labs Reviewed - No data to display  EKG None  Radiology Dg Ribs Unilateral W/chest Left  Result Date: 11/22/2017 CLINICAL DATA:   57 year old male with fall and left-sided rib pain. EXAM: LEFT RIBS AND CHEST - 3+ VIEW COMPARISON:  None. FINDINGS: The lungs are clear. There is no pleural effusion or pneumothorax. The cardiac silhouette is within normal limits. Old-appearing fracture of the left posterior eighth rib. Age indeterminate posterior left ninth rib fracture. Acute appearing fracture of the posterior left tenth rib IMPRESSION: 1. No acute cardiopulmonary process. 2. Multiple old left posterior rib fractures. Probable acute fracture of the posterior left tenth rib. Correlation with point tenderness recommended. Electronically Signed   By: Elgie Collard M.D.   On: 11/22/2017 06:26   Dg Lumbar Spine Complete  Result Date: 11/22/2017 CLINICAL DATA:  57 y/o M; fell yesterday onto left side with pain from the lateral side of left ribs to groin. EXAM: LUMBAR SPINE - COMPLETE 4+ VIEW COMPARISON:  None. FINDINGS: No acute fracture or loss of vertebral body height identified. Mild L1-2 and L5-S1 loss of intervertebral disc space height, multilevel small anterior marginal osteophytes, and lower lumbar facet arthrosis. Abdominal aortic calcific atherosclerosis. IMPRESSION: 1. No acute fracture or dislocation. 2. Lumbar spondylosis greatest at L1-2 and L5-S1 levels. Electronically Signed   By: Mitzi Hansen M.D.   On: 11/22/2017 06:28   Dg Hip Unilat W Or Wo Pelvis 2-3 Views Left  Result Date: 11/22/2017 CLINICAL DATA:  56 y/o M; fell yesterday onto left side with pain from the lateral side of left ribs to groin. EXAM: DG HIP (WITH OR WITHOUT PELVIS) 2-3V LEFT COMPARISON:  None. FINDINGS: There is no evidence of hip fracture or dislocation. There is no evidence of arthropathy or other focal bone abnormality. IMPRESSION: Negative. Electronically Signed   By: Mitzi Hansen M.D.   On: 11/22/2017 06:25    Procedures Procedures (including critical care time)  Medications Ordered in ED Medications - No data to  display   Initial Impression / Assessment and Plan / ED Course  I have reviewed the triage vital signs and the nursing notes.  Pertinent  imaging results that were available during my care of the patient were reviewed by me and considered in my medical decision making (see chart for details).     6:54 AM Patient very difficult historian.  When I first walked in the room, he was asleep and required significant movement to wake him up.  He then proceeded to tell me about his wife who passed away last Thanksgiving.  It was only after his brother came in the room that he start talking about the fall and his injuries.  It appears that his most significant site of pain is in his chest wall.  He does appear to have a rib fracture, but no PTX. Patient resting comfortably throughout his ED stay.   No distress noted.  No hypoxia.  Patient has been sleeping without any issue.  No signs of head injury.  No signs of abdominal trauma.  Other than rib fracture, imaging is  negative.  No other signs of spinal trauma.  No focal weakness. Final Clinical Impressions(s) / ED Diagnoses   Final diagnoses:  Fall, initial encounter  Back strain, initial encounter  Closed fracture of one rib of left side, initial encounter    ED Discharge Orders    None       Zadie RhineWickline, Juda Lajeunesse, MD 11/22/17 [phone]

## 2017-12-27 ENCOUNTER — Ambulatory Visit: Admit: 2017-12-27 | Discharge: 2017-12-28

## 2017-12-27 DIAGNOSIS — B182 Chronic viral hepatitis C: Principal | ICD-10-CM

## 2018-06-19 ENCOUNTER — Ambulatory Visit: Admit: 2018-06-19 | Discharge: 2018-06-20 | Payer: MEDICAID

## 2018-06-19 DIAGNOSIS — B182 Chronic viral hepatitis C: Principal | ICD-10-CM

## 2018-07-11 ENCOUNTER — Ambulatory Visit: Admit: 2018-07-11 | Discharge: 2018-07-11 | Payer: MEDICAID

## 2018-07-11 DIAGNOSIS — B182 Chronic viral hepatitis C: Principal | ICD-10-CM

## 2018-07-11 DIAGNOSIS — K740 Hepatic fibrosis: Secondary | ICD-10-CM

## 2018-07-31 MED ORDER — GLECAPREVIR 100 MG-PIBRENTASVIR 40 MG TABLET
Freq: Every day | ORAL | 1 refills | 0.00000 days | Status: CP
Start: 2018-07-31 — End: ?

## 2018-07-31 NOTE — Unmapped (Signed)
Per test claim for Mavyert at the [facility], patient needs Medication Assistance Program for Manufacturer Assistance.

## 2018-07-31 NOTE — Unmapped (Signed)
Specialty Medication Referral    Medication: Mavyret  Duration: 8 weeks  Prescriber: Dr. Pervis Hocking  Diagnosis:   B18.2 Hep C: yes    K74.60 Cirrhosis: yes,   Signs of liver decompensation: no    Child Pugh Score if applicable and for Medicaid pts: n/a  Z94.4 Liver Transplant: no  Genotype: 1a  (Ref Att 09/09/17 pg 1)  HCV RNA: 202,190 IU/ml   Date: 07/31/18   Fibrosis score: F4 per biopsy on 06/19/18  HIV Co-infection: no  Previous treatment: no

## 2018-08-07 NOTE — Unmapped (Signed)
Initial Counseling for HCV treatment     Planned regimen: Mavyret (glecaprevir/pibrentasvir 100/40 mg) 3 tabs daily x 8 weeks  Planned start date: TBD, pending delivery    Pharmacy: [facility] [phone] option #4    PMH:   Past Medical History:   Diagnosis Date   ??? Alcoholic cirrhosis of liver with ascites (CMS-HCC)    ??? Chronic hepatitis C (CMS-HCC)    ??? Gout    ??? Hypertension      Current Meds: lisinopril, aspirin, Aleve/Advil prn arthritis pain.    Patient is ready to start Mavyret.    Following topics were discussed during counseling:    1. Indications for medication, dosage and administration.     A. Mavyret (100/40 mg) 3 tablets to take once daily with food. Patient plans to take in the evenings after dinner.     2. Common side effects of medications and management strategies. (fatigue, headache)      3. Importance of adherence to regimen, follow-up clinic visits and lab monitoring.     A. Any barriers to coming to appointment? Yes, transportation. Will need transportation set up with Ms. Raney at [employer] [phone].  Instructed pt to call me once he receives his medication so we can setup his TW#4 appointment that will work with his county transportation.     4. Drug-drug interaction.    A. Current medications have been reviewed and assessed for possible interaction.    Denies use of herbal medication such as milk thistle or [place]. John's wart.  Allergies have been verified. Denies alcohol for past 2 weeks. Congratulated pt on his achievement so far and encouraged continued abstinence through treatment. Pt in agreement.      5. Importance of informing pharmacy and clinic of updated contact information.  Stressed importance of being able to reach over the phone to set up refills. Advised patient to call pharmacy when down to about 7 day supply left to ensure there's no interruption in therapy.    Patient verbalized understanding. Provided contact information for any questions/concerns.       Park Breed, Pharm D., BCPS, BCGP, CPP  [facility]  [address]  [place], [place] [address]  [phone]      August 07, 2018 12:59 PM

## 2018-08-15 NOTE — Unmapped (Addendum)
Reason for call: Coordinate TW # 4 apt + transportation with [facility]    Hep C  Genotype:1a  (Ref Att 09/09/17 pg 1)  Treatment: Mavyret x 8 wks  Start date: 08/15/18  Fibrosis:F4 per biopsy on 06/19/18    Called and spoke with pt at phone number [phone]. Pt stated he received the Mavyret yesterday and took his first dose today with breakfast. Pt stated he took all 3 pills. Pt is planning to take the Mavyret with breakfast every morning between 0600 am & 0800 am. Notified pt I will be working with [facility] to arrange his TW # 4 apt. I spoke with Haskel Schroeder at phone number [phone]. I was able to schedule a TW # 4 apt on 09/16/18 @ 10:40 am with Tina Griffiths, PA. The appointments need to be made between 0900 and noon. I sent Ms. Birdena Crandall an e-mail to lock in the transportation time. Her e-mail is '[email or web address] Pt was notified of the new appointment and instructed to call with any questions or concerns. Contact information provided.     Vertell Limber RN, BSN  Nursing Care Coordinator   Pharmacy Adult GI Medicine  [facility]  [address]   [place], [place] [address]  [phone]

## 2018-08-28 NOTE — Unmapped (Signed)
Follow-Up Counseling for HCV Treatment      Regimen: Mavyret (glecaprevir/pibrentasvir 100/40 mg)  x 8 weeks  Start Date: 08/15/18  Completed Treatment Week #2    Pharmacy: [facility] [phone]    Following topics were reviewed during the phone call:  1. Medication administration - Takes Mavyret 3 tabs every morning after he is done with breakfast between 8:00 & 0900 am.   2. Importance of adherence -   Pill count over the phone revealed #13 day supply which is appropriate. Advised patient to call pharmacy when down to about 7 day supply left to ensure there's no interruption in therapy.     3. Side effects - Pt reports his urine is looking more clear. Pt reports it was an orange/ red color before he started tx. Pt also reports his stool is normal. Pt reports he was having a lot of diarrhea.     4. Drug-drug interaction -Pt denies starting any new medications. Pt denies any alcohol use.     5. Follow up - Has follow up appointment scheduled in HCV treatment clinic on 09/16/18 @ 10:40 am with Tina Griffiths, PA. Transportation has been arranged through [employer].  Any barriers to coming to appointment? No.    All questions were answered.    Vertell Limber RN, [facility]   Pharmacy Adult GI Medicine  [facility]  [address]   [place], [place] [address]  [phone]    August 29, 2018 9:38 AM

## 2018-09-16 ENCOUNTER — Ambulatory Visit: Admit: 2018-09-16 | Discharge: 2018-09-17 | Payer: MEDICAID | Attending: Gastroenterology | Primary: Gastroenterology

## 2018-09-16 DIAGNOSIS — K7460 Unspecified cirrhosis of liver: Secondary | ICD-10-CM

## 2018-09-16 DIAGNOSIS — B182 Chronic viral hepatitis C: Principal | ICD-10-CM

## 2018-09-16 LAB — CREATININE
EGFR CKD-EPI AA MALE: >90 mL/min/{1.73_m2} (ref >=60)
EGFR CKD-EPI NON-AA MALE: >90 mL/min/{1.73_m2} (ref >=60)

## 2018-09-16 LAB — HEMOGLOBIN: Lab: 13.8

## 2018-09-16 LAB — CBC W/ AUTO DIFF
BASOPHILS ABSOLUTE COUNT: 0 10*9/L (ref 0.0–0.1)
EOSINOPHILS ABSOLUTE COUNT: 0.1 10*9/L (ref 0.0–0.4)
EOSINOPHILS RELATIVE PERCENT: 1.5 %
HEMATOCRIT: 42.8 % (ref 41.0–53.0)
HEMOGLOBIN: 13.8 g/dL (ref 13.5–17.5)
LARGE UNSTAINED CELLS: 4 % (ref 0–4)
LYMPHOCYTES ABSOLUTE COUNT: 1.4 10*9/L — ABNORMAL LOW (ref 1.5–5.0)
LYMPHOCYTES RELATIVE PERCENT: 25.6 %
MEAN CORPUSCULAR HEMOGLOBIN CONC: 32.3 g/dL (ref 31.0–37.0)
MEAN CORPUSCULAR HEMOGLOBIN: 33.9 pg (ref 26.0–34.0)
MEAN CORPUSCULAR VOLUME: 105.2 fL — ABNORMAL HIGH (ref 80.0–100.0)
MEAN PLATELET VOLUME: 8.2 fL (ref 7.0–10.0)
MONOCYTES ABSOLUTE COUNT: 0.6 10*9/L (ref 0.2–0.8)
NEUTROPHILS ABSOLUTE COUNT: 3.2 10*9/L (ref 2.0–7.5)
NEUTROPHILS RELATIVE PERCENT: 58.5 %
PLATELET COUNT: 160 10*9/L (ref 150–440)
RED BLOOD CELL COUNT: 4.07 10*12/L — ABNORMAL LOW (ref 4.50–5.90)
RED CELL DISTRIBUTION WIDTH: 13.5 % (ref 12.0–15.0)
WBC ADJUSTED: 5.6 10*9/L (ref 4.5–11.0)

## 2018-09-16 LAB — HEPATIC FUNCTION PANEL
ALBUMIN: 3.3 g/dL — ABNORMAL LOW (ref 3.5–5.0)
ALKALINE PHOSPHATASE: 229 U/L — ABNORMAL HIGH (ref 38–126)
ALT (SGPT): 29 U/L (ref <50)
AST (SGOT): 46 U/L (ref 19–55)
BILIRUBIN DIRECT: 0.8 mg/dL — ABNORMAL HIGH (ref 0.00–0.40)
PROTEIN TOTAL: 7.3 g/dL (ref 6.5–8.3)

## 2018-09-16 LAB — AFP-TUMOR MARKER: Alpha-1-Fetoprotein.tumor marker:MCnc:Pt:Ser/Plas:Qn:: 4

## 2018-09-16 LAB — BILIRUBIN TOTAL: Bilirubin:MCnc:Pt:Ser/Plas:Qn:: 2.4 — ABNORMAL HIGH

## 2018-09-16 LAB — FERRITIN: Ferritin:MCnc:Pt:Ser/Plas:Qn:: 181

## 2018-09-16 LAB — INR: Lab: 1.25

## 2018-09-16 LAB — SODIUM: Sodium:SCnc:Pt:Ser/Plas:Qn:: 136

## 2018-09-16 LAB — SMEAR REVIEW

## 2018-09-16 LAB — EGFR CKD-EPI NON-AA MALE: Lab: >90

## 2018-09-16 NOTE — Unmapped (Signed)
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  He was initially seen in hepatology clinic on 12/27/2017.  He is noted be genotype 1 a treatment na??ve.  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  It is presumed that his iron overload is secondary chronic hepatitis C.  He also has history of heavy alcohol use.  There was concern of underlying cirrhosis given his dual etiology of liver disease and therefore was scheduled for FibroScan.  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  He then subsequently underwent a liver biopsy on 07/11/2018 which showed F4 disease.  He denies any complications including variceal bleed, ascites or encephalopathy.  He has not undergone upper endoscopy.  He did undergo an abdominal ultrasound on 07/28/2018 which showed no evidence for HCC.  Spleen was normal.  Of note platelet count is > 200,000.  Since his last clinic visit he did quit alcohol on 07/27/2018.  Prior to that he would describe having 1/5 of vodka over weekend with some regular use during the weekdays.  In regards to his chronic hepatitis C, he is noted to be genotype 1a  treatment na??ve and was started on Mavyret on 08/15/2018.  He arrives today week #5 visit.  Plan to treat for 12 weeks.  Is overall tolerating therapy well.  Initial viral load was over 200,000 IU/mL.  His last ferritin on 11/11/2017 was 495.  He is immune to hepatitis A and has no immunity hepatitis B based on labs from March 2019.  Patient states a thought he had been vaccinated in the past.  He will check with his primary care provider.    Today in clinic he overall feels well denies any fever, chills, headache, jaundice, chest pain, upper lower GI bleeding, melena or confusion        PMH:  Patient Active Problem List   Diagnosis   ??? Osteoarthrosis   ??? 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 Hepatitis C     Past Medical History:   Diagnosis Date   ??? Alcoholic cirrhosis of liver with ascites (CMS-HCC)    ??? Chronic hepatitis C (CMS-HCC)    ??? Gout    ??? Hypertension        PSH:  Past Surgical History:   Procedure Laterality Date   ??? APPENDECTOMY     ??? FACIAL FRACTURE SURGERY         MEDICATIONS:  Current Outpatient Medications   Medication Sig Dispense Refill   ??? aspirin (ADULT LOW DOSE ASPIRIN) 81 MG tablet Take 81 mg by mouth once daily.     ??? glecaprevir-pibrentasvir (MAVYRET) 100-40 mg tablet Take 3 tablets by mouth once daily. Take with food. 1 4-week carton 1   ??? ibuprofen (ADVIL,MOTRIN) 200 MG tablet Take 200-400  mg by mouth daily as needed.      ??? lisinopril-hydrochlorothiazide (PRINZIDE,ZESTORETIC) 20-12.5 mg per tablet Take 1 tablet by mouth once daily.       No current facility-administered medications for this visit.        ALLERGIES:  Patient has no known allergies.    CIRRHOSIS CARE:  1. EGD: None  2. Imaging: Abdominal ultrasound 07/28/2018.  No HCC.  Spleen normal  3. Vaccination for hepatitis A and B: Immune to A; none to B  4. SBP proph [criteria: h/o SBP, or CP ? 9 w/ bili ? 3, renal insufficiency, or Na ? 130]:   5. Bone health: Unknown  6. Nutrition: wt & exercise mgmnt, low Na intake and bedtime snack discussed.  7. OTC agents: proper use of acetaminophen and avoidance of NSAIA's & HDS products discussed.  8. Transplant: Not evaluated secondary low meld score      SH: He does not use any tobacco products.  He quit alcohol 07/27/2018.  Prior to that drank 1/5 of vodka over the weekends with some regular use during the weekdays.      RoS: Negative on 10 systems review other than what is mentioned above.    PE: Blood pressure 143/69, pulse 75, temperature 36.6 ??C (97.9 ??F), temperature source Tympanic, height 175.3 cm (5' 9), weight 80.7 kg (177 lb 14.6 oz), SpO2 100 %. Body mass index is 26.27 kg/m??.;  WDWN, no acute distress; No scleral icterus; no xanthelasma; oropharynx clear; neck supple without lymphadenopathy, carotid bruits or jugular venous distention; Lungs were clear to the bases by auscultation and percussion; Heart: regular rate and rhythm, normal S1, S2 without significant murmur or gallop. Extremities: no clubbing, edema or palmer erythema.  Skin: no spider angiomata, no rash; Neuropsych: normal affect, speech pattern and cognitive function; oriented x 3; no asterixis.    LABS:  MELD-Na score: 14 at 09/16/2018 11:51 AM  MELD score: 13 at 09/16/2018 11:51 AM  Calculated from:  Serum Creatinine: 0.73 mg/dL (Rounded to 1 mg/dL) at 04/06/9146 82:95 AM  Serum Sodium: 136 mmol/L at 09/16/2018 11:51 AM  Total Bilirubin: 2.4 mg/dL at 02/14/3085 57:84 AM  INR(ratio): 1.25 at 09/16/2018 11:51 AM  Age: 24 years    All lab results last 72 hours:    Recent Results (from the past 72 hour(s))   PT-INR    Collection Time: 09/16/18 11:51 AM   Result Value Ref Range    PT 14.4 (H) 10.2 - 13.1 sec    INR 1.25    Creatinine, serum    Collection Time: 09/16/18 11:51 AM   Result Value Ref Range    Creatinine 0.73 0.70 - 1.30 mg/dL    EGFR CKD-EPI Non-African American, Male >90 >=60 mL/min/1.69m2    EGFR CKD-EPI African American, Male >90 >=60 mL/min/1.26m2   Sodium    Collection Time: 09/16/18 11:51 AM   Result Value Ref Range    Sodium 136 135 - 145 mmol/L   Hepatic Function Panel (LFTs)    Collection Time: 09/16/18 11:51 AM   Result Value Ref Range    Albumin 3.3 (L) 3.5 - 5.0 g/dL    Total Protein 7.3 6.5 - 8.3 g/dL    Total Bilirubin 2.4 (H) 0.0 - 1.2 mg/dL    Bilirubin, Direct 6.96 (H) 0.00 - 0.40 mg/dL    AST 46 19 - 55 U/L    ALT 29 <50 U/L    Alkaline Phosphatase 229 (H) 38 - 126 U/L   CBC  w/ Differential    Collection Time: 09/16/18 11:51 AM   Result Value Ref Range    WBC 5.6 4.5 - 11.0 10*9/L    RBC 4.07 (L) 4.50 - 5.90 10*12/L    HGB 13.8 13.5 - 17.5 g/dL    HCT 54.0 98.1 - 19.1 %    MCV 105.2 (H) 80.0 - 100.0 fL    MCH 33.9 26.0 - 34.0 pg    MCHC 32.3 31.0 - 37.0 g/dL    RDW 47.8 29.5 - 62.1 %    MPV 8.2 7.0 - 10.0 fL    Platelet 160 150 - 440 10*9/L    Neutrophils % 58.5 %    Lymphocytes % 25.6 %    Monocytes % 10.3 %    Eosinophils % 1.5 %    Basophils % 0.7 %    Absolute Neutrophils 3.2 2.0 - 7.5 10*9/L    Absolute Lymphocytes 1.4 (L) 1.5 - 5.0 10*9/L    Absolute Monocytes 0.6 0.2 - 0.8 10*9/L    Absolute Eosinophils 0.1 0.0 - 0.4 10*9/L    Absolute Basophils 0.0 0.0 - 0.1 10*9/L    Large Unstained Cells 4 0 - 4 %    Macrocytosis Marked (A) Not Present       ASSESSMENT:  Bryan Harrison is a pleasant 58 year old Caucasian.history 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  He has not been evaluated for transplant secondary low meld score (9).  He would also need substance abuse counseling.  He denies any complications including variceal bleed, ascites or hepatic encephalopathy.  He underwent abdominal ultrasound on 07/28/2018 showed no evidence for HCC.  He has not undergone upper endoscopy.  He previously underwent a FibroScan on 06/19/2018 which showed a kPa 73 indicating F4 disease.  This was followed up with a liver biopsy on 07/11/2018 which showed F4 disease.  In regards to his chronic hepatitis C is noted be genotype 1 a treatment na??ve with a viral load of greater than 200,000 IU/mL.  He initiated treatment on 08/15/2018 with Mavyret and arrives today at week #5 visit.  Plan to treat for 12 weeks.  Is overall tolerating therapy well with no complaints today in clinic.  He is immune to hepatitis A and shows no immunity to hepatitis B.        PLAN:  1. I have seen and examined this patient today in clinic and discussed his care with Dr. Pervis Hocking.  2.  Check laboratory studies including HCV RNA.  3.  Continue Mavyret as directed.  Plan to treat for 12 weeks.  4.  Patient will require repeat abdominal ultrasound in June 2020.  5.  Continue abstinence of alcohol.  6.  I did discuss the patient is not need of a transplant evaluation at this time.  Should the need arise he would need to complete a 87-month substance abuse counseling program.  7.  Plan to see Bryan Harrison back to liver clinic in 7 weeks which will be his end of treatment visit.    I spent a total of 25 minutes of which >50% was spent counseling and/or coordination of care regarding his liver disease    Bryan Harrison, Bryan Harrison  [facility]  [address]  [place], [place]  [address]  Ph: [phone]  Fax: [phone]

## 2018-09-18 LAB — HCV RNA LOG10: Lab: 0

## 2018-09-18 LAB — HEPATITIS C RNA, QUANTITATIVE, PCR: HCV RNA: NOT DETECTED

## 2018-09-19 NOTE — Unmapped (Signed)
Spoke with patient and made arrangements for him to go to [facility] ~ Feb 5 or 6 to repeat lab testing.  Orders for MELD labs to [facility] via portal.

## 2018-09-29 NOTE — Unmapped (Signed)
Follow-Up Counseling for HCV Treatment    I was provided with pt's new phone number from his mother. Pt's new phone # [phone]    Regimen: Mavyret (glecaprevir/pibrentasvir 100/40 mg)  x 8 weeks  Start Date: 08/15/18  Completed Treatment Week #6    Pharmacy: [facility] [phone]    Following topics were reviewed during the phone call:    1. Medication administration - Takes Mavyret 3 tabs every morning between 8 & 9 am with his breakfast.     2. Importance of adherence -   Pill count over the phone revealed #8 day supply which is appropriate.    3. Side effects - Pt reports having some cramping and pain around his belly button. Pt stated this has been going on for the past 5-6 days. Pt reports off and on loose stool. pt reports stool color has been normal. Advised pt if the pain continues or becomes worse to please follow up with PCP.     4. Drug-drug interaction - Pt denies starting any new medications or taking any OTC medications. Pt denies any alcohol use.     5. Follow up - Pt needs to be scheduled for a follow up apt. Pt has Medicaid transportation which can be arranged for his next apt. Pt will go to [facility] tomorrow morning to have labs completed.  Any barriers to coming to appointment? Yes, transportation.Notified pt that [facility] can provide transportation for his next apt.  Informed patient that there is still a small chance of relapse after finishing the treatment. Stressed importance of follow up 3 months post treatment to assess for cure.    All questions were answered.    Vertell Limber RN, [facility]   Pharmacy Adult GI Medicine  [facility]  [address]   [place], [place] [address]  [phone]    September 29, 2018 10:09 AM

## 2018-10-03 NOTE — Unmapped (Signed)
Pt called from [facility] on 10/02/18. I faxed over the lab orders because they could not see the orders that have been placed. Pt completed all labs. Notified pt that Misty Stanley RN will give him a call with the results. Pt expressed the difficulty with his arthritis coming to [facility] for follow up apts. Notified pt with F4 and cirrhosis he will need to be seen and have screening completed every 6 months. Also, notified pt we will need to see him at his P TW # 12 apt to assess for cure. Notified pt we will try our best to coordinate his apt's so that he can make as few as possible trips back and forth from [facility].  Pt verbalized understanding.      Vertell Limber RN, BSN  Nursing Care Coordinator   Pharmacy Adult GI Medicine  [facility]  [address]   [place], [place] [address]  [phone]

## 2018-10-10 NOTE — Unmapped (Signed)
-----   Message from [place], [place] sent at 09/19/2018  6:27 AM EST -----  Can you advise patient that his week 5 HCV RNA was not detected.    Thanks    Acupuncturist  ----- Message -----  From: Background User Lab  Sent: 09/16/2018  12:26 PM EST  To: Connye Burkitt, PA

## 2018-10-10 NOTE — Unmapped (Signed)
Spoke with patient and reviewed lab results. HCV not detected  Instructed patient with regard to medication: patient only has 3 days left of treatment meds    Instructed patient with regard to follow-up lab: wil;l need end of treatment and 3 mo post treatment labs - can get it done at [facility]     Answered questions about the results.   Patient / family member verbalized understanding    Ordered by: Tina Griffiths, PA-C

## 2018-10-10 NOTE — Unmapped (Signed)
See previous note

## 2018-10-27 NOTE — Unmapped (Signed)
Reason for call: Follow up on EOT labs    Hep C  Genotype:1a????(Ref Att 09/09/17 pg 1)  S/p Mavyret x 8 wks (08/15/18 - 10/09/18)  Fibrosis:F4 per biopsy on 06/19/18    Called pt at phone number [phone] to follow up on EOT labs that were faxed to [facility] by [employer]. Per phone conversation from Brent RN to pt on 10/09/18 pt was to go to [facility] to have EOT and post TW # 12 labs completed. I left pt a VM request to please return call. Contact information provided.       Vertell Limber RN, BSN  Nursing Care Coordinator   Pharmacy Adult GI Medicine  [facility]  [address]   [place], [place] [address]  [phone]

## 2018-10-29 LAB — HCV RT-PCR

## 2018-10-29 NOTE — Unmapped (Signed)
error 

## 2018-10-29 NOTE — Unmapped (Signed)
HCV RNA results not detected noted in Epic on 10/27/18. Erskine Squibb, CPP notified.       Vertell Limber RN, BSN  Nursing Care Coordinator   Pharmacy Adult GI Medicine  [facility]  [address]   [place], [place] [address]  [phone]

## 2018-12-17 NOTE — Unmapped (Signed)
Follow-Up Counseling for HCV Treatment    Regimen: Mavyret x 8 weeks  Start Date: 08/15/18  Completion date: 10/09/18  Post-TW# 11  Fibrosis: F4 per biopsy on 06/19/18??    Pharmacy:[facility] [phone]    Following topics were reviewed during the phone call:  New Phone Number: [phone]    1. Any medical changes or hospitalizations since completing treatment? no    2. Any changes to current medications? no    3. Any alcohol use? Yes, pt reports occasionally having a few beers. Pt stated after treatment ended he does have an occasional beer, but does not drink like he use to. Congratulated pt on his efforts in cutting back on alcohol, but stressed the importance of protecting his liver and remaining abstinent from alcohol.     4. Follow up - No follow up appointment has been scheduled d/t COVID-19. Notified patient Provider is doing tele health visits and we can schedule his apt and place HCV RNA lab orders to be completed at [facility]. Pt is agreeable to tele health visit with Tina Griffiths, PA. Stressed the importance of follow up 3 months post treatment to assess for cure. Informed patient that there is still a small chance of relapse after finishing the treatment.We discussed in the rare event that post-TW#12 lab results indicated relapse, pt will need to be re-treated. We also discussed the importance of continued cirrhosis care and importance of ongoing surveillance for [facility]. Discussed natural history of HCV cirrhosis after successful treatment.       5. Any barriers to tele health Post-TW#12 appointment? No, pt would prefer an early morning tele health appointment. Pt stated coming to [facility] is very difficult especially with COVID-19 and no Medicaid transportation.    All questions were answered.    Vertell Limber RN, [facility]   Pharmacy Adult GI Medicine  [facility]  [address]   [place], [place] [address]  [phone]    December 24, 2018 9:37 AM

## 2018-12-24 NOTE — Unmapped (Signed)
I left a message for pt to call and schedule appt.    Thanks,  Seward Grater

## 2018-12-29 NOTE — Unmapped (Signed)
Reason for call: Remind pt to call and schedule tele health apt + have labs done    Hep C  Genotype:  S/p Mavyret x 8 wks (08/15/18 - 10/09/18)  Fibrosis: F4 per biopsy on 06/19/18    Reminder call placed to pt at phone number [phone] for pt to remember to call Maggie back to set up tele health apt with Tina Griffiths, PA and to have labs completed this week at [facility] near his home to assess for cure. I left pt a VM request to please return call. Contact number provided.       Vertell Limber RN, BSN  Nursing Care Coordinator   Pharmacy Adult GI Medicine  [facility]  [address]   [place], [place] [address]  [phone]

## 2018-12-31 NOTE — Unmapped (Signed)
Reason for call: Coordinate labs and appointment with Tina Griffiths, PA    Hep C  Genotype:1a????(Ref Att 09/09/17 pg 1)  S/p Mavyret x 8 wks (08/15/18 - 10/09/18)  Fibrosis: F4 per biopsy on 06/19/18??    Called pt at phone number [phone] to coordinate labs and apt with Tina Griffiths, PA. Lab orders have been placed for pt to complete at [facility] closest to his home. I was unable to reach pt to leave a VM. The message stated caller is unavailable.      Vertell Limber RN, BSN  Nursing Care Coordinator   Pharmacy Adult GI Medicine  [facility]  [address]   [place], [place] [address]  [phone]

## 2019-01-07 NOTE — Unmapped (Signed)
Reason for call: Coordinate labs and appointment with Tina Griffiths, PA    Hep C  Genotype:1a????(Ref Att 09/09/17 pg 1)  S/p Mavyret x 8 wks (08/15/18 - 10/09/18)  Fibrosis: F4 per biopsy on 06/19/18??    Called pt at phone number [phone] to coordinate labs and apt with Tina Griffiths, PA. Lab orders have been placed for pt to complete at [facility] closest to his home. I was unable to reach pt to leave a VM. The message stated caller is unavailable.      Vertell Limber RN, BSN  Nursing Care Coordinator   Pharmacy Adult GI Medicine  [facility]  [address]   [place], [place] [address]  [phone]

## 2019-01-08 NOTE — Unmapped (Signed)
Reason for call: Coordinate labs and appointment with Tina Griffiths, PA  ??  Hep C  Genotype:1a????(Ref Att 09/09/17 pg 1)  S/p Mavyret x 8 wks (08/15/18 - 10/09/18)  Fibrosis:??F4 per biopsy on 06/19/18??  ??  Called pt at phone number [phone] to coordinate labs and apt with Tina Griffiths, PA. Lab orders have been placed for pt to complete at [facility] closest to his home. I spoke with pt and he stated he thought he was suppose to get paperwork in the mail before having labs done. Notified pt that he will just need to take his insurance card and ID and [facility] should be able to see the order placed. If he gets there and they are unable to see it I can fax it to them while he is there. Just give me a call while he is there and let me know. Once labs have been completed we can get him scheduled for a tele health visit with Tina Griffiths, PA. Pt stated he will try and get labs done in the next wk.       Vertell Limber RN, BSN  Nursing Care Coordinator   Pharmacy Adult GI Medicine  [facility]  [address]   [place], [place] [address]  [phone]

## 2019-01-20 LAB — HEPATITIS C QUANTITATION: Lab: NOT DETECTED

## 2019-01-20 LAB — HCV RT-PCR: HEPATITIS C QUANTITATION: NOT DETECTED [IU]/mL

## 2019-01-22 NOTE — Unmapped (Signed)
Reason for call: Provide HCV RNA results from 01/09/19    Hep C  Genotype:1a????(Ref Att 09/09/17 pg 1)  S/p Mavyret x 8 wks (08/15/18 - 10/09/18) SVR 01/09/19  Fibrosis:??F4 per biopsy on 06/19/18??    Called and spoke with pt at phone number [phone]. Notified pt his HCV RNA on 01/09/19 was not detected so you are cured. I let pt know that the HCV antibody will remain positive but this does not mean that he still has the HCV infection if HCV RNA remains negative. Patient notified that one does not develop immunity to hepatitis C and may get reinfected if exposed (i.e needle sharing). Other factors which may increase liver fibrosis (alcohol, illicit drugs, fatty liver disease). Notified pt d/t Fibrosis score of F4 he will need to be seen by Tina Griffiths, PA twice a year and imagining done twice a year. Pt last completed ultrasound at the [facility] in [place], [place] ph. [phone] ext: [phone]. This was last completed on 07/28/18. Pt would like to complete ultrasound at the same location and would like paperwork sent to him for his next apt. PT will also need assistance coordinating Medicaid transportation.       Vertell Limber RN, BSN  Nursing Care Coordinator   Pharmacy Adult GI Medicine  [facility]  [address]   [place], [place] [address]  [phone]

## 2019-02-16 NOTE — Unmapped (Addendum)
Reason for call: Coordinate transportation for upcoming apt    Hep C  Genotype:1a????(Ref Att 09/09/17 pg 1)  S/p Mavyret x 8 wks (08/15/18 - 10/09/18) SVR 01/09/19  Fibrosis:??F4 per biopsy on 06/19/18??    Called and spoke with pt at phone number [phone]. Pt would like transportation coordinated for his upcoming apt with Tina Griffiths, PA on 03/04/19 @ 9:00 am. I sent an e-mail to Ms. Rainey @ [facility] Transportation requesting transportation for upcoming apt. Provided my contact number if there was any issues.   Received a call back from Ms. Rainey. Pt is set up for transportation but will need $20 to cover trip there and back.     Vertell Limber RN, BSN  Nursing Care Coordinator   Pharmacy Adult GI Medicine  [facility]  [address]   [place], [place] [address]  [phone]

## 2019-02-17 NOTE — Unmapped (Signed)
Reason for call: Complete abd [place] order  faxed to [facility]    Hep C  Genotype:1a????(Ref Att 09/09/17 pg 1)  S/p Mavyret x 8 wks (08/15/18 - 10/09/18) SVR 01/09/19  Fibrosis:??F4 per biopsy on 06/19/18??    Called pt to let him know he will need to complete an abdominal ultrasound at [facility] screening. Last ultrasound completed 12/19. Pt is scheduled to see Tina Griffiths, PA on 03/04/19 @ 9:00 am. Transportation has been set up with [facility]. I left pt a VM request to please return call.        Vertell Limber RN, BSN  Nursing Care Coordinator   Pharmacy Adult GI Medicine  [facility]  [address]   [place], [place] [address]  [phone]

## 2019-02-18 NOTE — Unmapped (Signed)
Reason for call: Notified pt of scheduled ultrasound  ??  Hep C  Genotype:1a????(Ref Att 09/09/17 pg 1)  S/p Mavyret x 8 wks (08/15/18 - 10/09/18) SVR 01/09/19  Fibrosis:??F4 per biopsy on 06/19/18??    Called and spoke with pt at phone number [phone]. Notified pt he is scheduled for an ultrasound at [facility]: [address] [place], [place] [address]. The ultrasound is scheduled on July 6th at 9:50 am. NPO after midnight. Also notified pt his transportation is scheduled through [facility] for apt with Tina Griffiths, PA on 03/04/19 at 9:00 am. Pt notified he will need $20 round trip. Pt verbalized understanding and thanked me for the assistance.       Vertell Limber RN, BSN  Nursing Care Coordinator   Pharmacy Adult GI Medicine  [facility]  [address]   [place], [place] [address]  [phone]

## 2019-02-27 NOTE — Unmapped (Signed)
Reason for call: Remind pt of ultrasound scheduled Monday at 9:50 am    Hep C  Genotype:1a????(Ref Att 09/09/17 pg 1)  S/p Mavyret x 8 wks (08/15/18 - 10/09/18)??SVR 01/09/19  Fibrosis:??F4 per biopsy on 06/19/18??    Reminder call placed to pt at phone number [phone]. Pt stated he will need to cancel both appointments this week d/t a death in the family. Pt stated he will give me a call back when he is able to reschedule his appointments. Provided my condolences to pt and his family. Provided my call back number. Stressed the importance of 6 month follow up appointments for cirrhosis care and HCC screening. I called [facility] and canceled ultrasound. I also sent an e-mail to [facility] and canceled transportation.     Vertell Limber RN, BSN  Nursing Care Coordinator   Pharmacy Adult GI Medicine  [facility]  [address]   [place], [place] [address]  [phone]

## 2019-03-02 NOTE — Unmapped (Signed)
Reason for call: Returning call    Hep C  Genotype:1a????(Ref Att 09/09/17 pg 1)  S/p Mavyret x 8 wks (08/15/18 - 10/09/18)??SVR 01/09/19  Fibrosis:??F4 per biopsy on 06/19/18??    Received a VM from pt's mother Bryan Harrison who requested for me to please call her back. I returned call to Rubin Payor this morning and left my number for her to please give me a call back at her convenience. Pt has given permission to provide any information to his mother.        Vertell Limber RN, BSN  Nursing Care Coordinator   Pharmacy Adult GI Medicine  [facility]  [address]   [place], [place] [address]  [phone]

## 2019-08-28 DEATH — deceased
